# Patient Record
Sex: Female | Born: 1965 | Race: White | Hispanic: No | Marital: Married | Smoking: Never smoker
Health system: Southern US, Community
[De-identification: ages and names within clinical notes are randomized; demographics above are authoritative.]

## PROBLEM LIST (undated history)

## (undated) DIAGNOSIS — E119 Type 2 diabetes mellitus without complications: Secondary | ICD-10-CM

---

## 1998-07-30 ENCOUNTER — Other Ambulatory Visit: Admission: RE | Admit: 1998-07-30 | Discharge: 1998-07-30 | Payer: Self-pay | Admitting: Obstetrics and Gynecology

## 1999-04-28 ENCOUNTER — Encounter: Admission: RE | Admit: 1999-04-28 | Discharge: 1999-07-27 | Payer: Self-pay | Admitting: Obstetrics & Gynecology

## 1999-09-11 ENCOUNTER — Encounter: Admission: RE | Admit: 1999-09-11 | Discharge: 1999-12-10 | Payer: Self-pay | Admitting: *Deleted

## 1999-09-23 ENCOUNTER — Inpatient Hospital Stay (HOSPITAL_COMMUNITY): Admission: AD | Admit: 1999-09-23 | Discharge: 1999-09-24 | Payer: Self-pay | Admitting: Obstetrics and Gynecology

## 1999-09-23 ENCOUNTER — Encounter: Payer: Self-pay | Admitting: Obstetrics and Gynecology

## 1999-09-29 ENCOUNTER — Ambulatory Visit (HOSPITAL_COMMUNITY): Admission: RE | Admit: 1999-09-29 | Discharge: 1999-09-29 | Payer: Self-pay | Admitting: Obstetrics and Gynecology

## 1999-10-03 ENCOUNTER — Inpatient Hospital Stay (HOSPITAL_COMMUNITY): Admission: AD | Admit: 1999-10-03 | Discharge: 1999-10-03 | Payer: Self-pay | Admitting: Obstetrics and Gynecology

## 1999-10-10 ENCOUNTER — Inpatient Hospital Stay (HOSPITAL_COMMUNITY): Admission: AD | Admit: 1999-10-10 | Discharge: 1999-10-13 | Payer: Self-pay | Admitting: Obstetrics and Gynecology

## 1999-11-12 ENCOUNTER — Other Ambulatory Visit: Admission: RE | Admit: 1999-11-12 | Discharge: 1999-11-12 | Payer: Self-pay | Admitting: Obstetrics and Gynecology

## 2000-11-25 ENCOUNTER — Other Ambulatory Visit: Admission: RE | Admit: 2000-11-25 | Discharge: 2000-11-25 | Payer: Self-pay | Admitting: Obstetrics and Gynecology

## 2002-03-07 ENCOUNTER — Other Ambulatory Visit: Admission: RE | Admit: 2002-03-07 | Discharge: 2002-03-07 | Payer: Self-pay | Admitting: Obstetrics and Gynecology

## 2002-10-18 ENCOUNTER — Encounter (INDEPENDENT_AMBULATORY_CARE_PROVIDER_SITE_OTHER): Payer: Self-pay | Admitting: *Deleted

## 2002-10-19 ENCOUNTER — Ambulatory Visit (HOSPITAL_COMMUNITY): Admission: RE | Admit: 2002-10-19 | Discharge: 2002-10-19 | Payer: Self-pay | Admitting: Obstetrics and Gynecology

## 2003-04-23 ENCOUNTER — Other Ambulatory Visit: Admission: RE | Admit: 2003-04-23 | Discharge: 2003-04-23 | Payer: Self-pay | Admitting: Obstetrics and Gynecology

## 2005-08-31 ENCOUNTER — Other Ambulatory Visit: Admission: RE | Admit: 2005-08-31 | Discharge: 2005-08-31 | Payer: Self-pay | Admitting: Obstetrics and Gynecology

## 2007-01-20 ENCOUNTER — Encounter: Admission: RE | Admit: 2007-01-20 | Discharge: 2007-01-20 | Payer: Self-pay | Admitting: Obstetrics and Gynecology

## 2007-12-12 ENCOUNTER — Encounter: Admission: RE | Admit: 2007-12-12 | Discharge: 2007-12-12 | Payer: Self-pay | Admitting: Family Medicine

## 2008-02-02 ENCOUNTER — Encounter: Admission: RE | Admit: 2008-02-02 | Discharge: 2008-02-02 | Payer: Self-pay | Admitting: Obstetrics and Gynecology

## 2008-04-10 IMAGING — CT CT MAXILLOFACIAL W/O CM
3 of 5 series · 17 of 47 positions shown, 20 images · IV contrast (agent unspecified)
Comparison: none

CLINICAL DATA: Nasal and facial contusion after a fall.  Swollen right cheek.
 MAXILLOFACIAL CT WITHOUT CONTRAST:
TECHNIQUE: Axial and coronal CT imaging was performed through the maxillofacial structures.  No intravenous contrast was administered.

[Series 2: bone windows · axial · 0.33mm/px · z∈[-87,+60]mm · 11 of 69 slices shown, 14 images]
[im 5/69  brain]
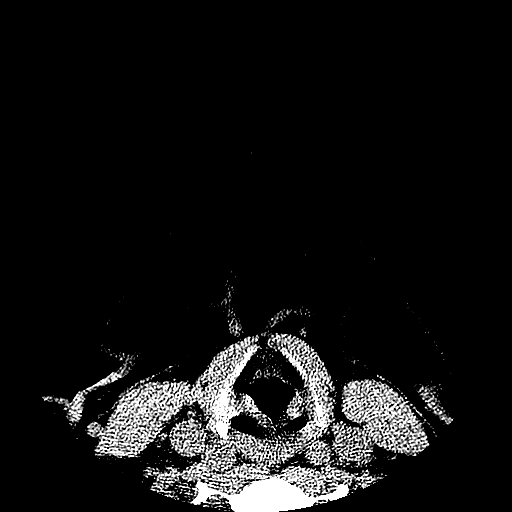
[im 5/69  bone]
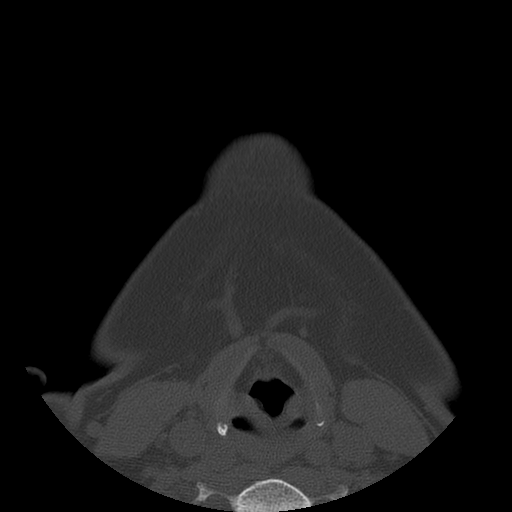
[im 10/69  bone]
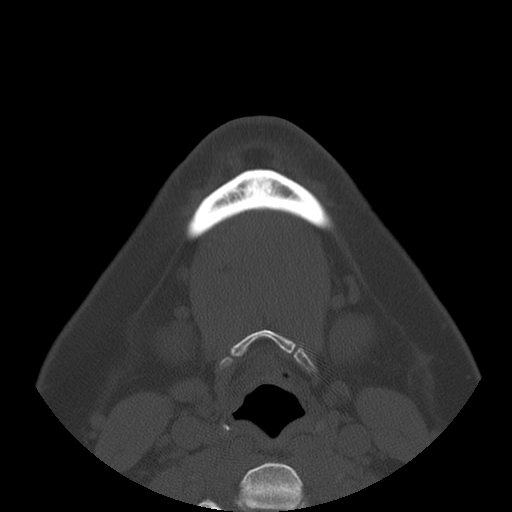
[im 15/69  bone]
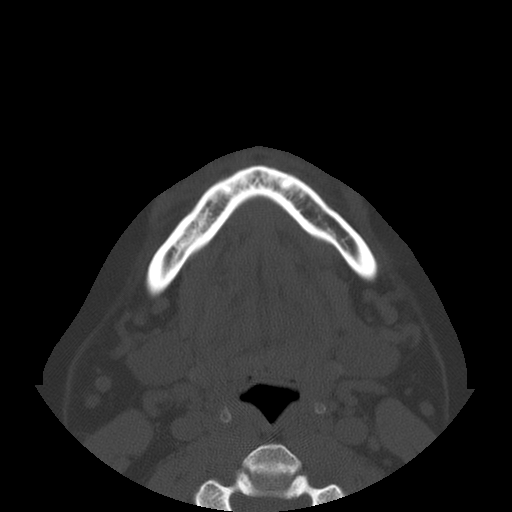
[im 25/69  bone]
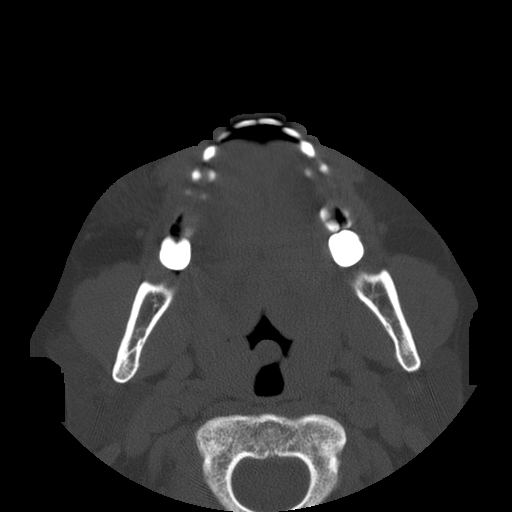
[im 30/69  brain]
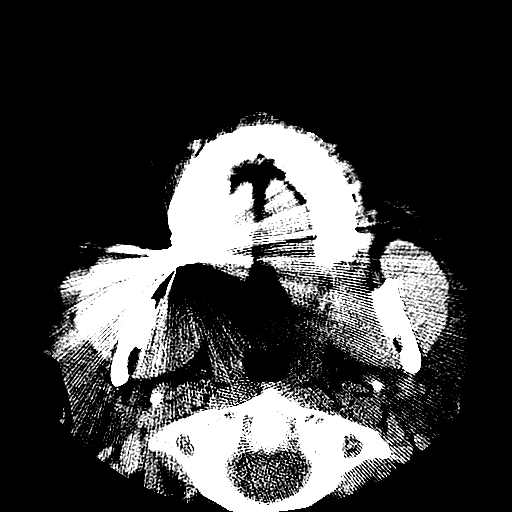
[im 30/69  bone]
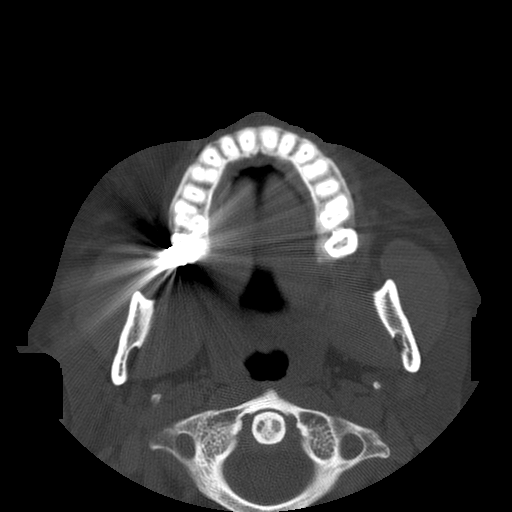
[im 35/69  bone]
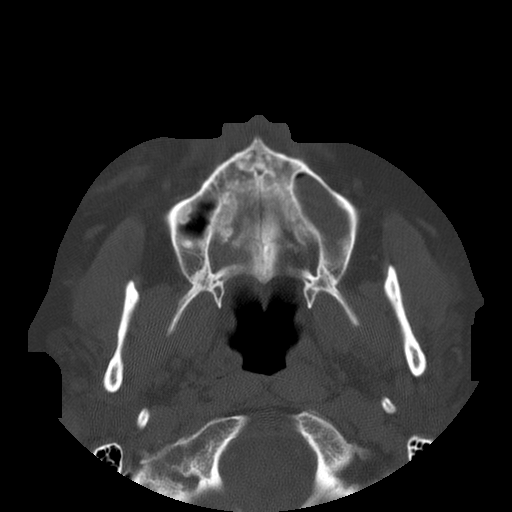
[im 39/69  bone]
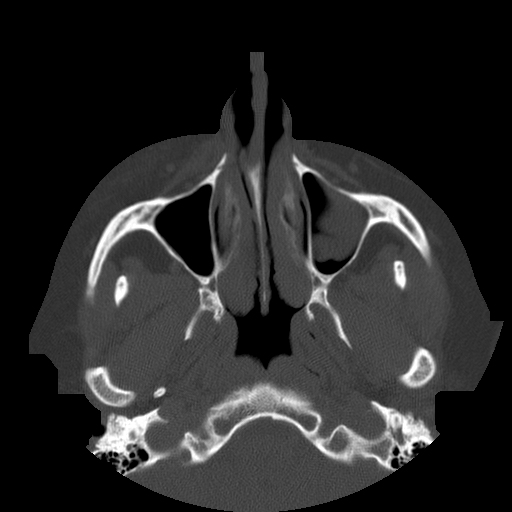
[im 44/69  bone]
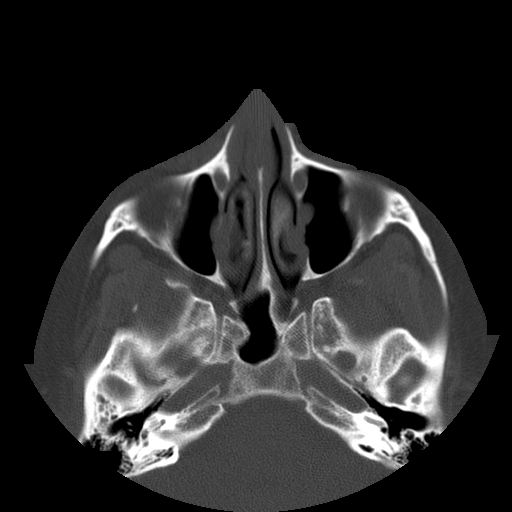
[im 54/69  brain]
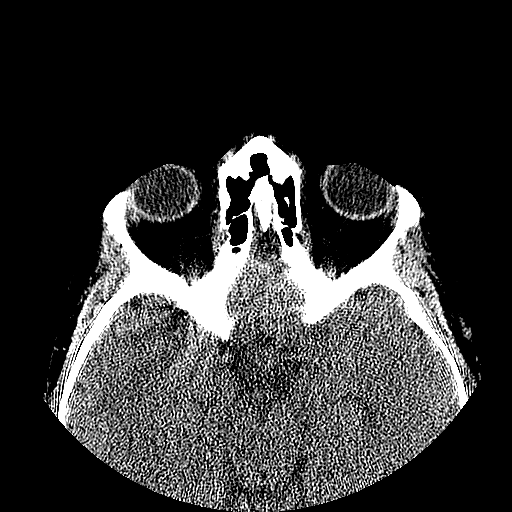
[im 54/69  bone]
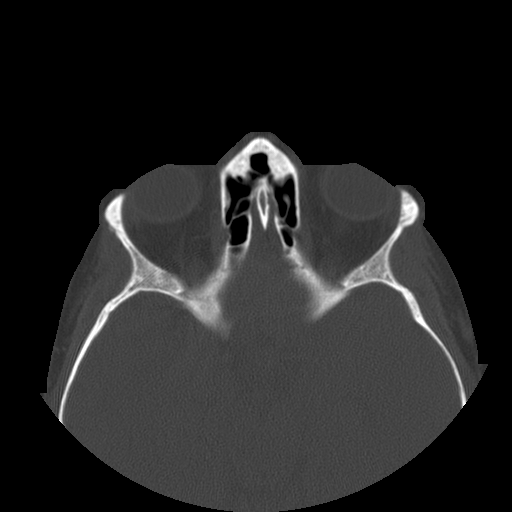
[im 59/69  bone]
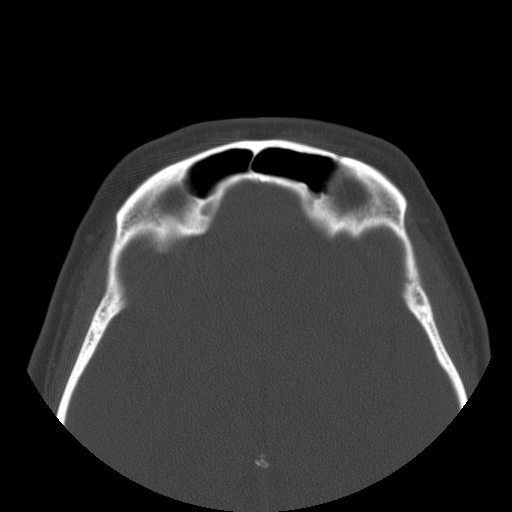
[im 64/69  bone]
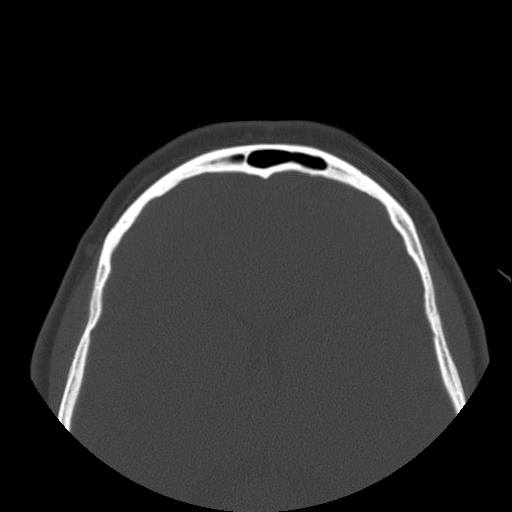

[Series 400: cor/bone · coronal · 0.34mm/px · 3 of 58 slices shown]
[im 15/58  bone]
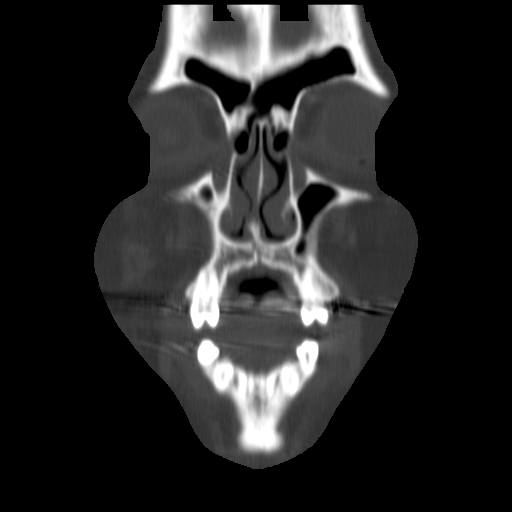
[im 29/58  bone]
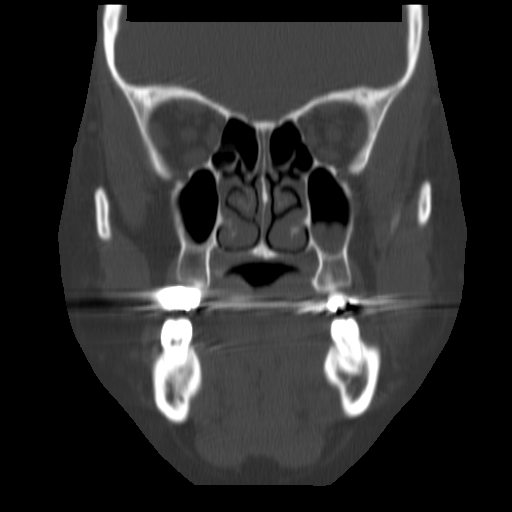
[im 43/58  bone]
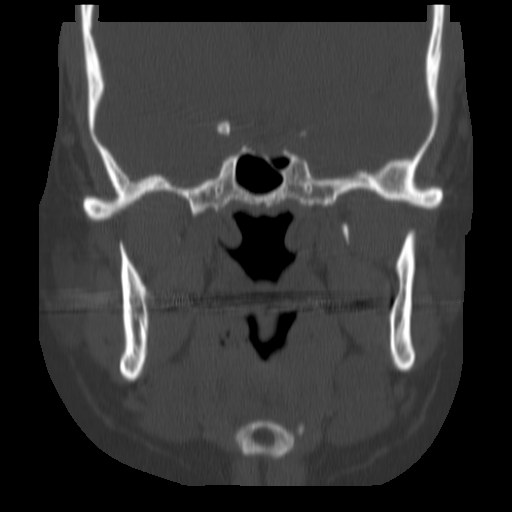

[Series 401: sag/bone · sagittal · 0.34mm/px · 3 of 66 slices shown]
[im 22/66  bone]
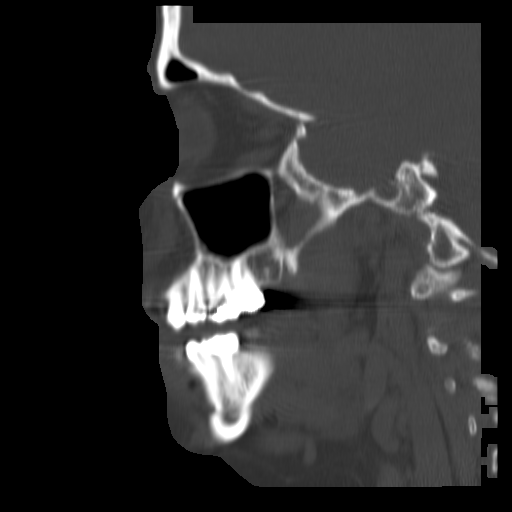
[im 33/66  bone]
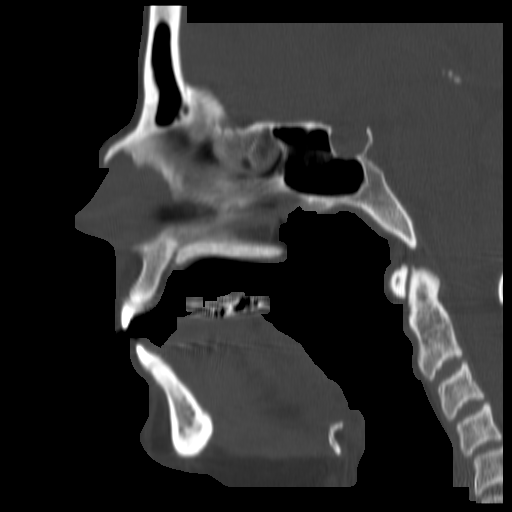
[im 44/66  bone]
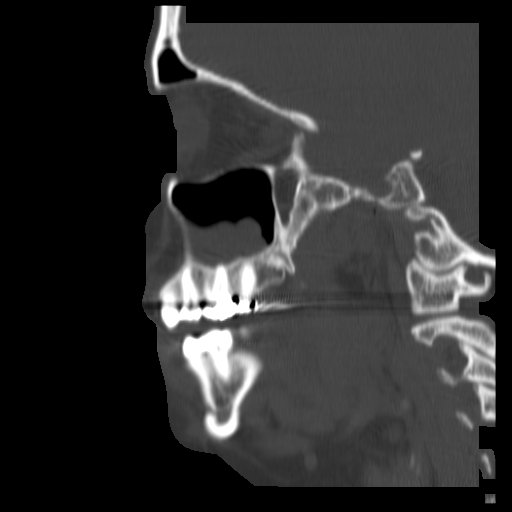

[17 of 47 positions shown; findings below may reference images not displayed]

FINDINGS: There is soft tissue swelling in the right cheek region.  There is no sign of facial fracture.  No sign of orbital injury.  There is a retention cyst at the floor of the left maxillary sinus but no sign of inflammatory sinus disease.
IMPRESSION: No fracture.  Swelling in the right cheek region.

## 2010-12-22 ENCOUNTER — Other Ambulatory Visit: Payer: Self-pay | Admitting: Internal Medicine

## 2010-12-22 DIAGNOSIS — R1900 Intra-abdominal and pelvic swelling, mass and lump, unspecified site: Secondary | ICD-10-CM

## 2010-12-25 ENCOUNTER — Ambulatory Visit
Admission: RE | Admit: 2010-12-25 | Discharge: 2010-12-25 | Disposition: A | Discharge status: Home / Self Care | Payer: BC Managed Care – PPO | Source: Ambulatory Visit | Attending: Internal Medicine | Admitting: Internal Medicine

## 2010-12-25 ENCOUNTER — Other Ambulatory Visit: Payer: Self-pay

## 2010-12-25 DIAGNOSIS — R1900 Intra-abdominal and pelvic swelling, mass and lump, unspecified site: Secondary | ICD-10-CM

## 2010-12-25 MED ORDER — IOHEXOL 300 MG/ML  SOLN
125.0000 mL | Freq: Once | INTRAMUSCULAR | Status: AC | PRN
  Administered 2010-12-25: 125 mL via INTRAVENOUS

## 2011-02-26 NOTE — H&P (Signed)
   NAME:  Cheryl Cox, Cheryl Cox                        ACCOUNT NO.:  [id number]   MEDICAL RECORD NO.:  [id number]                   PATIENT TYPE:  AMB   LOCATION:  [facility]                                  FACILITY:  [facility]   PHYSICIAN:  Dineen Kid. Rana Snare, M.D.                 DATE OF BIRTH:  1966/03/11   DATE OF ADMISSION:  10/19/2001  DATE OF DISCHARGE:                                HISTORY & PHYSICAL   HISTORY OF PRESENT ILLNESS:  Cheryl Cox is a 45 year old G2, P2 with abnormal  uterine bleeding, has varied depending upon birth control pills.  She had a  sonohysterogram which showed a 5.c echogenic area within the endometrial  cavity consistent with a polyp, otherwise normal-appearing uterus.  She  presents for hysteroscopy and D&C.   PAST MEDICAL HISTORY:  Significant for mild hypertension.   PAST OBSTETRICAL HISTORY:  She has had 2 vaginal deliveries complicated by  chronic hypertension and gestational diabetes.   PAST SURGICAL HISTORY:  Negative.   PHYSICAL EXAMINATION:  VITAL SIGNS:  Blood pressure 132/88.  LUNGS:  Clear to auscultation bilaterally.  CARDIOVASCULAR:  Regular rate and rhythm.  ABDOMEN:  Nontender, nondistended.  PELVIC:  Normal external genitalia, Bartholin's, Skene's, urethral.  Uterus  anteverted, mobile, nontender, no adnexal masses are palpable.   IMPRESSION/PLAN:  Abnormal uterine bleeding, endometrial mass on  sonohysterogram, bleeding not controlled with oral contraceptive agents.   Recommend hysteroscopy, D&C for evaluation and treatment of this. Risks and  benefits were discussed at length, which include, but are not limited to  risks of infection, bleeding, damage to uterus, tubes, ovaries, bowel or  bladder.  Informed consent was obtained.                                                     Dineen Kid Rana Snare, M.D.    DCL/MEDQ  D:  10/18/2002  T:  10/18/2002  Job:  [id number]

## 2011-02-26 NOTE — Op Note (Signed)
NAME:  Cheryl Cox, Cheryl Cox                        ACCOUNT NO.:  [id number]   MEDICAL RECORD NO.:  [id number]                   PATIENT TYPE:  AMB   LOCATION:  [facility]                                  FACILITY:  [facility]   PHYSICIAN:  Dineen Kid. Rana Snare, M.D.                 DATE OF BIRTH:  01/29/1966   DATE OF PROCEDURE:  10/19/2002  DATE OF DISCHARGE:                                 OPERATIVE REPORT   PREOPERATIVE DIAGNOSES:  1. Abnormal uterine bleeding.  2. Endometrial mass on sonohysterogram.   POSTOPERATIVE DIAGNOSES:  1. Abnormal uterine bleeding.  2. Endometrial mass on sonohysterogram.   PROCEDURES:  1. Hysteroscopy.  2. Dilatation and curettage.   SURGEON:  Dineen Kid. Rana Snare, M.D.   ANESTHESIA:  Monitored anesthetic care and paracervical block.   INDICATIONS FOR PROCEDURE:  The patient is a 45 year old G2, P2, with  irregular abnormal uterine bleeding not controlled with birth control pills.  She underwent a sonohysterogram which showed a posterior endometrial  polypoid area approximately 5 mm in size.  She was then sedated for  hysteroscopy, dilatation and curettage.  Risks and benefits were discussed  at length.  Informed consent was obtained.   FINDINGS AT TIME OF SURGERY:  Thickening of the posterior endometrium  consistent with either polypoid material or thickened endometrium.  Otherwise, normal-appearing ostia.  The endometrium was normal in appearance  after curettage.   DESCRIPTION OF PROCEDURE:  After adequate analgesia, the patient placed in  the dorsal lithotomy position.  She was sterilely prepped and draped and the  bladder was sterilely drained.  A Graves speculum was placed.  Xylocaine 1%  was injected at the anterior lip of the cervix.  Paracervical block was  placed with 1% Xylocaine with 1:100,000 epinephrine, 20 cc used.  The uterus  sounded to 8 cm easily and dilated with a 27 Pratt dilator and the  hysteroscope was inserted.  The above findings were noted.   This was  followed by sharp curettage which retrieved a small amount of endometrial  tissue.  After thorough curettage was performed the hysteroscope was  reinserted and the polypoid material which was previously seen was absent  and at this point a normal endometrium, normal-appearing ostia bilaterally  were noted.  There was some difficulty with the camera today so pictures  were not able to be taken.  At this time, because the endometrial lining  appeared to be normal, the hysteroscope was removed.  The tenaculum was  removed from the anterior lip of the cervix and after direct pressure  hemostasis was achieved.  Cervical deficit was 50 cc.  Estimated blood loss  less than 10 cc.  The patient tolerated the procedure well, was stable on  transfer to the recovery room.  Sponge, instrument, and needle counts were  normal x3.    DISPOSITION:  The patient will be discharged to home.  Will follow up  in the  office in two to three weeks.  She is sent home with a routine instruction  sheet for D&C.                                               Dineen Kid Rana Snare, M.D.    DCL/MEDQ  D:  10/19/2002  T:  10/19/2002  Job:  [id number]

## 2016-12-03 DIAGNOSIS — L0100 Impetigo, unspecified: Secondary | ICD-10-CM | POA: Diagnosis not present

## 2016-12-11 DIAGNOSIS — L03211 Cellulitis of face: Secondary | ICD-10-CM | POA: Diagnosis not present

## 2016-12-30 DIAGNOSIS — E1165 Type 2 diabetes mellitus with hyperglycemia: Secondary | ICD-10-CM | POA: Diagnosis not present

## 2016-12-30 DIAGNOSIS — I10 Essential (primary) hypertension: Secondary | ICD-10-CM | POA: Diagnosis not present

## 2017-01-04 DIAGNOSIS — E1165 Type 2 diabetes mellitus with hyperglycemia: Secondary | ICD-10-CM | POA: Diagnosis not present

## 2017-01-04 DIAGNOSIS — I10 Essential (primary) hypertension: Secondary | ICD-10-CM | POA: Diagnosis not present

## 2017-01-04 DIAGNOSIS — E785 Hyperlipidemia, unspecified: Secondary | ICD-10-CM | POA: Diagnosis not present

## 2017-04-04 DIAGNOSIS — E785 Hyperlipidemia, unspecified: Secondary | ICD-10-CM | POA: Diagnosis not present

## 2017-04-04 DIAGNOSIS — E1165 Type 2 diabetes mellitus with hyperglycemia: Secondary | ICD-10-CM | POA: Diagnosis not present

## 2017-04-04 DIAGNOSIS — I10 Essential (primary) hypertension: Secondary | ICD-10-CM | POA: Diagnosis not present

## 2017-04-07 DIAGNOSIS — E1165 Type 2 diabetes mellitus with hyperglycemia: Secondary | ICD-10-CM | POA: Diagnosis not present

## 2017-04-07 DIAGNOSIS — E559 Vitamin D deficiency, unspecified: Secondary | ICD-10-CM | POA: Diagnosis not present

## 2017-04-07 DIAGNOSIS — I10 Essential (primary) hypertension: Secondary | ICD-10-CM | POA: Diagnosis not present

## 2017-06-30 DIAGNOSIS — E1165 Type 2 diabetes mellitus with hyperglycemia: Secondary | ICD-10-CM | POA: Diagnosis not present

## 2017-07-05 DIAGNOSIS — E559 Vitamin D deficiency, unspecified: Secondary | ICD-10-CM | POA: Diagnosis not present

## 2017-07-05 DIAGNOSIS — E1165 Type 2 diabetes mellitus with hyperglycemia: Secondary | ICD-10-CM | POA: Diagnosis not present

## 2017-07-05 DIAGNOSIS — I10 Essential (primary) hypertension: Secondary | ICD-10-CM | POA: Diagnosis not present

## 2017-10-24 DIAGNOSIS — Z01419 Encounter for gynecological examination (general) (routine) without abnormal findings: Secondary | ICD-10-CM | POA: Diagnosis not present

## 2017-10-27 DIAGNOSIS — E1165 Type 2 diabetes mellitus with hyperglycemia: Secondary | ICD-10-CM | POA: Diagnosis not present

## 2017-10-27 DIAGNOSIS — E559 Vitamin D deficiency, unspecified: Secondary | ICD-10-CM | POA: Diagnosis not present

## 2017-10-27 DIAGNOSIS — E785 Hyperlipidemia, unspecified: Secondary | ICD-10-CM | POA: Diagnosis not present

## 2017-11-03 DIAGNOSIS — I10 Essential (primary) hypertension: Secondary | ICD-10-CM | POA: Diagnosis not present

## 2017-11-03 DIAGNOSIS — E559 Vitamin D deficiency, unspecified: Secondary | ICD-10-CM | POA: Diagnosis not present

## 2017-11-03 DIAGNOSIS — E1165 Type 2 diabetes mellitus with hyperglycemia: Secondary | ICD-10-CM | POA: Diagnosis not present

## 2017-11-28 DIAGNOSIS — D124 Benign neoplasm of descending colon: Secondary | ICD-10-CM | POA: Diagnosis not present

## 2017-11-28 DIAGNOSIS — Z1211 Encounter for screening for malignant neoplasm of colon: Secondary | ICD-10-CM | POA: Diagnosis not present

## 2017-11-28 DIAGNOSIS — K635 Polyp of colon: Secondary | ICD-10-CM | POA: Diagnosis not present

## 2017-11-28 DIAGNOSIS — D125 Benign neoplasm of sigmoid colon: Secondary | ICD-10-CM | POA: Diagnosis not present

## 2017-11-29 DIAGNOSIS — K635 Polyp of colon: Secondary | ICD-10-CM | POA: Diagnosis not present

## 2017-11-29 DIAGNOSIS — D125 Benign neoplasm of sigmoid colon: Secondary | ICD-10-CM | POA: Diagnosis not present

## 2017-11-29 DIAGNOSIS — D124 Benign neoplasm of descending colon: Secondary | ICD-10-CM | POA: Diagnosis not present

## 2018-02-06 DIAGNOSIS — Z7689 Persons encountering health services in other specified circumstances: Secondary | ICD-10-CM | POA: Diagnosis not present

## 2018-02-06 DIAGNOSIS — E1165 Type 2 diabetes mellitus with hyperglycemia: Secondary | ICD-10-CM | POA: Diagnosis not present

## 2018-02-06 DIAGNOSIS — E785 Hyperlipidemia, unspecified: Secondary | ICD-10-CM | POA: Diagnosis not present

## 2018-03-08 DIAGNOSIS — H04123 Dry eye syndrome of bilateral lacrimal glands: Secondary | ICD-10-CM | POA: Diagnosis not present

## 2018-03-08 DIAGNOSIS — E119 Type 2 diabetes mellitus without complications: Secondary | ICD-10-CM | POA: Diagnosis not present

## 2018-04-27 DIAGNOSIS — E1165 Type 2 diabetes mellitus with hyperglycemia: Secondary | ICD-10-CM | POA: Diagnosis not present

## 2018-05-04 DIAGNOSIS — I10 Essential (primary) hypertension: Secondary | ICD-10-CM | POA: Diagnosis not present

## 2018-05-04 DIAGNOSIS — E1165 Type 2 diabetes mellitus with hyperglycemia: Secondary | ICD-10-CM | POA: Diagnosis not present

## 2018-10-18 DIAGNOSIS — E1165 Type 2 diabetes mellitus with hyperglycemia: Secondary | ICD-10-CM | POA: Diagnosis not present

## 2018-10-18 DIAGNOSIS — E559 Vitamin D deficiency, unspecified: Secondary | ICD-10-CM | POA: Diagnosis not present

## 2018-10-18 DIAGNOSIS — E785 Hyperlipidemia, unspecified: Secondary | ICD-10-CM | POA: Diagnosis not present

## 2018-10-23 DIAGNOSIS — E559 Vitamin D deficiency, unspecified: Secondary | ICD-10-CM | POA: Diagnosis not present

## 2018-10-23 DIAGNOSIS — E1165 Type 2 diabetes mellitus with hyperglycemia: Secondary | ICD-10-CM | POA: Diagnosis not present

## 2018-10-23 DIAGNOSIS — E785 Hyperlipidemia, unspecified: Secondary | ICD-10-CM | POA: Diagnosis not present

## 2018-12-21 DIAGNOSIS — Z6838 Body mass index (BMI) 38.0-38.9, adult: Secondary | ICD-10-CM | POA: Diagnosis not present

## 2018-12-21 DIAGNOSIS — Z01419 Encounter for gynecological examination (general) (routine) without abnormal findings: Secondary | ICD-10-CM | POA: Diagnosis not present

## 2020-07-09 ENCOUNTER — Encounter (HOSPITAL_COMMUNITY): Payer: Self-pay

## 2020-07-09 ENCOUNTER — Other Ambulatory Visit: Payer: Self-pay

## 2020-07-09 DIAGNOSIS — R6883 Chills (without fever): Secondary | ICD-10-CM | POA: Insufficient documentation

## 2020-07-09 DIAGNOSIS — E119 Type 2 diabetes mellitus without complications: Secondary | ICD-10-CM | POA: Diagnosis not present

## 2020-07-09 DIAGNOSIS — R112 Nausea with vomiting, unspecified: Secondary | ICD-10-CM | POA: Insufficient documentation

## 2020-07-09 DIAGNOSIS — R1013 Epigastric pain: Secondary | ICD-10-CM | POA: Insufficient documentation

## 2020-07-09 DIAGNOSIS — Z7984 Long term (current) use of oral hypoglycemic drugs: Secondary | ICD-10-CM | POA: Insufficient documentation

## 2020-07-09 DIAGNOSIS — Z20822 Contact with and (suspected) exposure to covid-19: Secondary | ICD-10-CM | POA: Insufficient documentation

## 2020-07-09 DIAGNOSIS — R1011 Right upper quadrant pain: Secondary | ICD-10-CM | POA: Diagnosis present

## 2020-07-09 LAB — CBC
HCT: 40.3 % (ref 36.0–46.0)
Hemoglobin: 14.3 g/dL (ref 12.0–15.0)
MCH: 31.8 pg (ref 26.0–34.0)
MCHC: 35.5 g/dL (ref 30.0–36.0)
MCV: 89.8 fL (ref 80.0–100.0)
Platelets: 291 10*3/uL (ref 150–400)
RBC: 4.49 MIL/uL (ref 3.87–5.11)
RDW: 12.5 % (ref 11.5–15.5)
WBC: 13.6 10*3/uL — ABNORMAL HIGH (ref 4.0–10.5)
nRBC: 0 % (ref 0.0–0.2)

## 2020-07-09 LAB — I-STAT BETA HCG BLOOD, ED (MC, WL, AP ONLY): I-stat hCG, quantitative: <5 m[IU]/mL (ref <5)

## 2020-07-09 LAB — COMPREHENSIVE METABOLIC PANEL
ALT: 22 U/L (ref 0–44)
AST: 22 U/L (ref 15–41)
Albumin: 4.3 g/dL (ref 3.5–5.0)
Alkaline Phosphatase: 73 U/L (ref 38–126)
Anion gap: 16 — ABNORMAL HIGH (ref 5–15)
BUN: 12 mg/dL (ref 6–20)
CO2: 19 mmol/L — ABNORMAL LOW (ref 22–32)
Calcium: 9.7 mg/dL (ref 8.9–10.3)
Chloride: 103 mmol/L (ref 98–111)
Creatinine, Ser: 0.74 mg/dL (ref 0.44–1.00)
GFR calc Af Amer: >60 mL/min (ref >60)
GFR calc non Af Amer: >60 mL/min (ref >60)
Glucose, Bld: 183 mg/dL — ABNORMAL HIGH (ref 70–99)
Potassium: 4.4 mmol/L (ref 3.5–5.1)
Sodium: 138 mmol/L (ref 135–145)
Total Bilirubin: 1.3 mg/dL — ABNORMAL HIGH (ref 0.3–1.2)
Total Protein: 8.4 g/dL — ABNORMAL HIGH (ref 6.5–8.1)

## 2020-07-09 LAB — LIPASE, BLOOD: Lipase: 29 U/L (ref 11–51)

## 2020-07-09 NOTE — ED Triage Notes (Signed)
Pt sts upper abdominal pain since 1000 today. Seen at [facility] and directed here to evaluate liver.

## 2020-07-10 ENCOUNTER — Emergency Department (HOSPITAL_COMMUNITY)
Admission: EM | Admit: 2020-07-10 | Discharge: 2020-07-10 | Disposition: A | Discharge status: Home / Self Care | Payer: 59 | Attending: Emergency Medicine | Admitting: Emergency Medicine

## 2020-07-10 ENCOUNTER — Emergency Department (HOSPITAL_COMMUNITY): Payer: 59

## 2020-07-10 DIAGNOSIS — R112 Nausea with vomiting, unspecified: Secondary | ICD-10-CM

## 2020-07-10 DIAGNOSIS — R1013 Epigastric pain: Secondary | ICD-10-CM

## 2020-07-10 HISTORY — DX: Type 2 diabetes mellitus without complications: E11.9

## 2020-07-10 LAB — URINALYSIS, ROUTINE W REFLEX MICROSCOPIC
Bilirubin Urine: NEGATIVE
Glucose, UA: NEGATIVE mg/dL
Ketones, ur: 80 mg/dL — AB
Nitrite: NEGATIVE
Protein, ur: 30 mg/dL — AB
Specific Gravity, Urine: 1.028 (ref 1.005–1.030)
pH: 5 (ref 5.0–8.0)

## 2020-07-10 LAB — RESP PANEL BY RT PCR (RSV, FLU A&B, COVID)
Influenza A by PCR: NEGATIVE
Influenza B by PCR: NEGATIVE
Respiratory Syncytial Virus by PCR: NEGATIVE
SARS Coronavirus 2 by RT PCR: NEGATIVE

## 2020-07-10 MED ORDER — OMEPRAZOLE 20 MG PO CPDR: 20.0000 mg | DELAYED_RELEASE_CAPSULE | Freq: Every day | ORAL | 0 refills | Status: AC

## 2020-07-10 MED ORDER — PROMETHAZINE HCL 25 MG/ML IJ SOLN
12.5000 mg | Freq: Once | INTRAMUSCULAR | Status: AC
  Administered 2020-07-10: 12.5 mg via INTRAVENOUS
  Filled 2020-07-10: qty 1

## 2020-07-10 MED ORDER — PROMETHAZINE HCL 12.5 MG RE SUPP: 12.5000 mg | Freq: Three times a day (TID) | RECTAL | 0 refills | Status: AC | PRN

## 2020-07-10 MED ORDER — ONDANSETRON 4 MG PO TBDP
4.0000 mg | ORAL_TABLET | Freq: Once | ORAL | Status: AC
  Administered 2020-07-10: 4 mg via ORAL
  Filled 2020-07-10: qty 1

## 2020-07-10 MED ORDER — ALUM & MAG HYDROXIDE-SIMETH 200-200-20 MG/5ML PO SUSP
30.0000 mL | Freq: Once | ORAL | Status: AC
  Administered 2020-07-10: 30 mL via ORAL
  Filled 2020-07-10: qty 30

## 2020-07-10 MED ORDER — LIDOCAINE VISCOUS HCL 2 % MT SOLN
15.0000 mL | Freq: Once | OROMUCOSAL | Status: AC
  Administered 2020-07-10: 15 mL via ORAL
  Filled 2020-07-10: qty 15

## 2020-07-10 MED ORDER — MORPHINE SULFATE (PF) 4 MG/ML IV SOLN
4.0000 mg | Freq: Once | INTRAVENOUS | Status: AC
  Administered 2020-07-10: 4 mg via INTRAVENOUS
  Filled 2020-07-10: qty 1

## 2020-07-10 NOTE — ED Notes (Signed)
Discharge paperwork reviewed with pt. No questions or concerns expressed by pt.  Pt ambulatory with significant other to ED entrance.

## 2020-07-10 NOTE — Discharge Instructions (Signed)
You have been seen here for nausea and vomiting.  Lab work and imaging look reassuring.  I prescribed you Phenergan suppository please use as prescribed for nausea and vomiting.  I have also prescribed you an acid pill please take as prescribed.  I recommend staying away from spicy and acidic foods.  I also recommend staying from carbonated drinks and alcohol as this can worsen GERD-like symptoms.  I recommend follow-up with general surgeon for further evaluation management of your epigastric pain.  Come back to the emergency department if you develop severe abdominal pain, unable to hold food or water down, chest pain, shortness of breath, nausea, vomiting, diarrhea

## 2020-07-10 NOTE — ED Provider Notes (Signed)
[place] DEPT Provider Note   CSN: [id number] Arrival date & time: 07/09/20  1813     History Chief Complaint  Patient presents with  . Abdominal Pain    Cheryl Cox is a 54 y.o. female.  HPI   Patient with significant medical history of diabetes on Metformin presents to the emergent department with chief complaint of right upper abdominal pain that started yesterday morning at 10 AM.  Patient states she has had a sharp-like pain in her right upper quadrant as well as her epigastric region.  She endorses multiple episodes of vomiting which she describes as a yellow bile-like substance.  She denies seeing any blood or coffee Sorber emesis.  She does states she felt some chills but denies any fevers, diarrhea, dysuria, lower back pain.  She has no GI history, she states she still has her gallbladder and appendix, denies NSAIDs use, alcohol use, no history of pancreatitis, gallstones, small bowel obstructions.  Patient states she has been unable to hold any food or water down since yesterday.  She states lying down on her back tends to make her more nauseous and sodium makes her feel better.  Patient denies headache, fever, sore throat, cough, chest pain, shortness of breath, dysuria, worsening pedal edema.  Past Medical History:  Diagnosis Date  . Diabetes mellitus without complication ([place])     There are no problems to display for this patient.   History reviewed. No pertinent surgical history.   OB History   No obstetric history on file.     No family history on file.  Social History   Tobacco Use  . Smoking status: Not on file  Substance Use Topics  . Alcohol use: Not on file  . Drug use: Not on file    Home Medications Prior to Admission medications   Medication Sig Start Date End Date Taking? Authorizing Provider  atorvastatin (LIPITOR) 20 MG tablet Take 20 mg by mouth at bedtime.   Yes [provider]  Dulaglutide 1.5  MG/0.5ML SOPN Inject 1.5 mg into the skin every Sunday.   Yes [provider]  lisinopril (ZESTRIL) 5 MG tablet Take 5 mg by mouth at bedtime.   Yes [provider]  metFORMIN (GLUCOPHAGE-XR) 500 MG 24 hr tablet Take 2,000 mg by mouth at bedtime. 07/07/20  Yes [provider]  omeprazole (PRILOSEC) 20 MG capsule Take 1 capsule (20 mg total) by mouth daily for 21 days. 07/10/20 07/31/20  Marcello Fennel, PA-C  promethazine (PHENERGAN) 12.5 MG suppository Place 1 suppository (12.5 mg total) rectally every 8 (eight) hours as needed for up to 12 doses for nausea or vomiting. 07/10/20   Marcello Fennel, PA-C    Allergies    Patient has no known allergies.  Review of Systems   Review of Systems  Physical Exam Updated Vital Signs BP (!) 158/67   Pulse 87   Temp 97.9 F (36.6 C)   Resp 15   Ht 5\' 8"  (1.727 m)   Wt 104.3 kg   SpO2 98%   BMI 34.97 kg/m   Physical Exam  ED Results / Procedures / Treatments   Labs (all labs ordered are listed, but only abnormal results are displayed) Labs Reviewed  COMPREHENSIVE METABOLIC PANEL - Abnormal; Notable for the following components:      Result Value   CO2 19 (*)    Glucose, Bld 183 (*)    Total Protein 8.4 (*)    Total  Bilirubin 1.3 (*)    Anion gap 16 (*)    All other components within normal limits  CBC - Abnormal; Notable for the following components:   WBC 13.6 (*)    All other components within normal limits  URINALYSIS, ROUTINE W REFLEX MICROSCOPIC - Abnormal; Notable for the following components:   APPearance CLOUDY (*)    Hgb urine dipstick SMALL (*)    Ketones, ur 80 (*)    Protein, ur 30 (*)    Leukocytes,Ua TRACE (*)    Bacteria, UA FEW (*)    All other components within normal limits  RESP PANEL BY RT PCR (RSV, FLU A&B, COVID)  LIPASE, BLOOD  I-STAT BETA HCG BLOOD, ED ([facility], [facility], [facility] ONLY)    EKG None  Radiology US Abdomen Limited  Result Date: 07/10/2020 CLINICAL DATA:  Right upper  quadrant pain EXAM: ULTRASOUND ABDOMEN LIMITED RIGHT UPPER QUADRANT COMPARISON:  CT 12/25/2010 FINDINGS: Gallbladder: Echogenic nonshadowing nodule adjacent to the gallbladder wall measuring up to 5 mm suggestive of a polyp. No shadowing gallstones or wall thickening visualized. No sonographic Murphy sign noted by sonographer. Common bile duct: Diameter: 5 mm. Liver: No focal lesion identified. Within normal limits in parenchymal echogenicity. Portal vein is patent on color Doppler imaging with normal direction of blood flow towards the liver. Other: None. IMPRESSION: 1. Benign 5 mm gallbladder polyp incidentally noted. No evaluation or follow-up recommended. This recommendation follows ACR consensus guidelines: White Paper of the ACR Incidental Findings Committee II on Gallbladder and Biliary Findings. J Am Coll Radiol 2013:;10:953-956. 2. Otherwise unremarkable ultrasound of the right upper quadrant. No gallstones or evidence of cholecystitis. Electronically Signed   By: Davina Poke D.O.   On: 07/10/2020 08:58    Procedures Procedures (including critical care time)  Medications Ordered in ED Medications  ondansetron (ZOFRAN-ODT) disintegrating tablet 4 mg (4 mg Oral Given 07/10/20 0831)  promethazine (PHENERGAN) injection 12.5 mg (12.5 mg Intravenous Given 07/10/20 1004)  alum & mag hydroxide-simeth (MAALOX/MYLANTA) 200-200-20 MG/5ML suspension 30 mL (30 mLs Oral Given 07/10/20 1008)    And  lidocaine (XYLOCAINE) 2 % viscous mouth solution 15 mL (15 mLs Oral Given 07/10/20 1008)  morphine 4 MG/ML injection 4 mg (4 mg Intravenous Given 07/10/20 1000)    ED Course  I have reviewed the triage vital signs and the nursing notes.  Pertinent labs & imaging results that were available during my care of the patient were reviewed by me and considered in my medical decision making (see chart for details).    MDM Rules/Calculators/A&P                          Patient presents with epigastric and right  upper quadrant pain that started yesterday.  Patient was alert, did not appear in acute distress, vital signs reassuring.  Will order screening labs as well as imaging for further evaluation.  Will provide patient with antiemetics, pain medication and reevaluate.  CMP does not show electrolyte abnormality, metabolic acidosis CO2 19, hyperglycemia of 183, no AKI, T bili 1.3, anion gap 16.  CBC shows leukocytosis of 13.3, no signs of anemia.  Lipase is 29, hCG less than 5, UA positive for ketones, proteins, trace leukocytes, few bacteria.  Respiratory negative for Covid, influenza a/B, RSV.  Limited ultrasound shows benign 5 mm gallbladder polyp no recommend patient for further follow-up.  Patient was reevaluated after providing antiemetics and pain medication.  She states she is feeling  much better and has no more abdominal pain and is tolerating p.o. without any difficulty.  I have low suspicion for systemic infection as patient is nontoxic-appearing, vital signs reassuring, no obvious source of infection on exam.  I suspect leukocytosis is an acute phase reactant from the consistent nausea and vomiting or possible dehydration as she has ketones noted in her urine.  Low suspicion for intra-abdominal abnormality requiring surgical intervention as patient denies abdominal pain, tolerating p.o., no acute abdomen noted on exam.  Low suspicion for UTI, pyelonephritis as she denies urinary symptoms, UA shows trace leukocytes with few bacteria will not treat as she is asymptomatic.  Low suspicion patient is having from pancreatitis as she had a lipase of 29.  I suspect patient's CO2 of 19 and anion gap most likely secondary to her elevated glucose.  Will recommend fluid rehydration.  I suspect patient's pain may be secondary to the polyp noted in her gallbladder.  But cannot exclude exclude possible gastritis.  Will start patient on PPIs, provide antiemetics and have her follow-up with general surgery for further  evaluation.  Patient's vital signs have remained stable, no indication for hospital admission.  Patient was discussed with attending who agrees assessment plan.  Patient was given at home care strict return precautions.  Patient verbalized understood and agreed to plan. Final Clinical Impression(s) / ED Diagnoses Final diagnoses:  Epigastric pain  Non-intractable vomiting with nausea, unspecified vomiting type    Rx / DC Orders ED Discharge Orders         Ordered    promethazine (PHENERGAN) 12.5 MG suppository  Every 8 hours PRN        07/10/20 1204    omeprazole (PRILOSEC) 20 MG capsule  Daily        07/10/20 1204           Aron Baba 07/10/20 1339    Veryl Speak, MD 07/10/20 1416

## 2020-07-10 NOTE — ED Notes (Signed)
Pt able to eat pack of gram crackers and drink a cup of water without vomiting. States she stills feel a little nauseated.

## 2020-07-11 ENCOUNTER — Emergency Department (HOSPITAL_COMMUNITY): Admission: EM | Admit: 2020-07-11 | Discharge: 2020-07-11 | Discharge status: Against Medical Advice | Payer: 59

## 2020-07-11 ENCOUNTER — Other Ambulatory Visit: Payer: Self-pay

## 2020-07-12 ENCOUNTER — Encounter (HOSPITAL_COMMUNITY): Payer: Self-pay | Admitting: Emergency Medicine

## 2020-07-12 ENCOUNTER — Emergency Department (HOSPITAL_COMMUNITY): Payer: 59

## 2020-07-12 ENCOUNTER — Emergency Department (HOSPITAL_COMMUNITY)
Admission: EM | Admit: 2020-07-12 | Discharge: 2020-07-12 | Disposition: A | Discharge status: Home / Self Care | Payer: 59 | Attending: Emergency Medicine | Admitting: Emergency Medicine

## 2020-07-12 DIAGNOSIS — R002 Palpitations: Secondary | ICD-10-CM | POA: Diagnosis not present

## 2020-07-12 DIAGNOSIS — Z7984 Long term (current) use of oral hypoglycemic drugs: Secondary | ICD-10-CM | POA: Diagnosis not present

## 2020-07-12 DIAGNOSIS — R030 Elevated blood-pressure reading, without diagnosis of hypertension: Secondary | ICD-10-CM | POA: Insufficient documentation

## 2020-07-12 DIAGNOSIS — Z79899 Other long term (current) drug therapy: Secondary | ICD-10-CM | POA: Insufficient documentation

## 2020-07-12 DIAGNOSIS — E119 Type 2 diabetes mellitus without complications: Secondary | ICD-10-CM | POA: Diagnosis not present

## 2020-07-12 DIAGNOSIS — E876 Hypokalemia: Secondary | ICD-10-CM | POA: Diagnosis not present

## 2020-07-12 DIAGNOSIS — R079 Chest pain, unspecified: Secondary | ICD-10-CM | POA: Diagnosis present

## 2020-07-12 DIAGNOSIS — R112 Nausea with vomiting, unspecified: Secondary | ICD-10-CM

## 2020-07-12 LAB — CBC
HCT: 41.6 % (ref 36.0–46.0)
Hemoglobin: 14.2 g/dL (ref 12.0–15.0)
MCH: 30.9 pg (ref 26.0–34.0)
MCHC: 34.1 g/dL (ref 30.0–36.0)
MCV: 90.6 fL (ref 80.0–100.0)
Platelets: 293 10*3/uL (ref 150–400)
RBC: 4.59 MIL/uL (ref 3.87–5.11)
RDW: 12.1 % (ref 11.5–15.5)
WBC: 11.4 10*3/uL — ABNORMAL HIGH (ref 4.0–10.5)
nRBC: 0 % (ref 0.0–0.2)

## 2020-07-12 LAB — BASIC METABOLIC PANEL
Anion gap: 10 (ref 5–15)
BUN: 10 mg/dL (ref 6–20)
CO2: 24 mmol/L (ref 22–32)
Calcium: 8.9 mg/dL (ref 8.9–10.3)
Chloride: 99 mmol/L (ref 98–111)
Creatinine, Ser: 0.63 mg/dL (ref 0.44–1.00)
GFR calc Af Amer: >60 mL/min (ref >60)
GFR calc non Af Amer: >60 mL/min (ref >60)
Glucose, Bld: 119 mg/dL — ABNORMAL HIGH (ref 70–99)
Potassium: 3.3 mmol/L — ABNORMAL LOW (ref 3.5–5.1)
Sodium: 133 mmol/L — ABNORMAL LOW (ref 135–145)

## 2020-07-12 LAB — TROPONIN I (HIGH SENSITIVITY): Troponin I (High Sensitivity): 13 ng/L (ref <18)

## 2020-07-12 MED ORDER — ONDANSETRON HCL 4 MG PO TABS: 4.0000 mg | ORAL_TABLET | Freq: Three times a day (TID) | ORAL | 0 refills | Status: AC | PRN

## 2020-07-12 MED ORDER — ONDANSETRON 8 MG PO TBDP
8.0000 mg | ORAL_TABLET | Freq: Once | ORAL | Status: AC
  Administered 2020-07-12: 8 mg via ORAL
  Filled 2020-07-12: qty 1

## 2020-07-12 MED ORDER — POTASSIUM CHLORIDE CRYS ER 20 MEQ PO TBCR: 20.0000 meq | EXTENDED_RELEASE_TABLET | Freq: Two times a day (BID) | ORAL | 0 refills | Status: AC

## 2020-07-12 NOTE — Discharge Instructions (Addendum)
Drink plenty of fluids (clear liquids) then start a bland diet later this morning such as toast, crackers, jello, [employer] chicken noodle soup. Use the phenergan for nausea or vomiting. If your vomiting continues, get the zofran filled.  Take the potassium pills until gone, your potassium was low from the vomiting.  Please follow up with Dr Ashby Dawes about your blood pressure once you are feeling better.  Pain, stress, and anxiety can make your blood pressure get higher.

## 2020-07-12 NOTE — ED Provider Notes (Signed)
[facility] EMERGENCY DEPARTMENT Provider Note   CSN: [id number] Arrival date & time: 07/12/20  0024   Time seen 5:44 AM  History Chief Complaint  Patient presents with  . Chest Pain    Cheryl Cox is a 54 y.o. female.  HPI   Patient states she was having abdominal pain and was seen at [facility] emergency department on September 29 and was diagnosed of polyps in her gallbladder.  She states she was discharged from the hospital at noon on September 30.  She states since she got home she has a pounding in her chest and a fluttering.  She denies chest pain, shortness of breath, swelling of her legs, cough, or fever.  She has continued to have nausea and vomiting since 1030 on September 20 which prompted her ED visit.  Last time she vomited was at 130 this morning and she vomited twice on October 1.  She denies any diarrhea and states she has not had a bowel movement however she has not eaten in 3 days.  She states she has been taking her blood pressure at home and it has been very elevated.  She is not on medications for blood pressure but states because of her diabetes she was put on lisinopril.  Patient states she had gestational diabetes and has 2 children in the early 41s.  She has been treated for diabetes for 14 years.  She states her diabetes has been easy to control.  When she was seen in the ED she was prescribed Phenergan which she states she has not gotten filled because the pharmacy did not have it elevated a do have it now and omeprazole which she states she is taking.  Dates she took her blood pressure at home and it was 181/112 and she became concerned that something was wrong with her heart.  [facility] Dr. Ashby Dawes   Past Medical History:  Diagnosis Date  . Diabetes mellitus without complication ([place])     There are no problems to display for this patient.   History reviewed. No pertinent surgical history.   OB History   No obstetric  history on file.     History reviewed. No pertinent family history.  Social History   Tobacco Use  . Smoking status: Never Smoker  . Smokeless tobacco: Never Used  Vaping Use  . Vaping Use: Never used  Substance Use Topics  . Alcohol use: Not Currently  . Drug use: Never    Home Medications Prior to Admission medications   Medication Sig Start Date End Date Taking? Authorizing Provider  atorvastatin (LIPITOR) 20 MG tablet Take 20 mg by mouth at bedtime.    [provider]  Dulaglutide 1.5 MG/0.5ML SOPN Inject 1.5 mg into the skin every Sunday.    [provider]  lisinopril (ZESTRIL) 5 MG tablet Take 5 mg by mouth at bedtime.    [provider]  metFORMIN (GLUCOPHAGE-XR) 500 MG 24 hr tablet Take 2,000 mg by mouth at bedtime. 07/07/20   [provider]  omeprazole (PRILOSEC) 20 MG capsule Take 1 capsule (20 mg total) by mouth daily for 21 days. 07/10/20 07/31/20  Marcello Fennel, PA-C  ondansetron (ZOFRAN) 4 MG tablet Take 1 tablet (4 mg total) by mouth every 8 (eight) hours as needed. 07/12/20   Rolland Porter, MD  potassium chloride SA (KLOR-CON) 20 MEQ tablet Take 1 tablet (20 mEq total) by mouth 2 (two) times daily. 07/12/20   Rolland Porter,  MD  promethazine (PHENERGAN) 12.5 MG suppository Place 1 suppository (12.5 mg total) rectally every 8 (eight) hours as needed for up to 12 doses for nausea or vomiting. 07/10/20   Marcello Fennel, PA-C    Allergies    Patient has no known allergies.  Review of Systems   Review of Systems  All other systems reviewed and are negative.   Physical Exam Updated Vital Signs BP (!) 166/89   Pulse 67   Temp 98.9 F (37.2 C) (Oral)   Resp 17   Ht 5\' 8"  (1.727 m)   Wt 104 kg   SpO2 98%   BMI 34.86 kg/m   Physical Exam Vitals and nursing note reviewed.  Constitutional:      General: She is not in acute distress.    Appearance: She is obese. She is not ill-appearing or toxic-appearing.  HENT:      Head: Normocephalic and atraumatic.     Right Ear: External ear normal.     Left Ear: External ear normal.     Mouth/Throat:     Mouth: Mucous membranes are moist.  Eyes:     Extraocular Movements: Extraocular movements intact.     Conjunctiva/sclera: Conjunctivae normal.     Pupils: Pupils are equal, round, and reactive to light.  Cardiovascular:     Rate and Rhythm: Normal rate and regular rhythm.     Pulses: Normal pulses.     Heart sounds: Normal heart sounds. No murmur heard.   Pulmonary:     Effort: Pulmonary effort is normal. No respiratory distress.     Breath sounds: Normal breath sounds.  Chest:     Chest wall: No tenderness.  Abdominal:     General: Bowel sounds are normal.     Palpations: Abdomen is soft.     Tenderness: There is no abdominal tenderness.  Musculoskeletal:        General: Normal range of motion.     Cervical back: Normal range of motion.  Skin:    General: Skin is warm and dry.     Findings: No rash.  Neurological:     General: No focal deficit present.     Mental Status: She is alert and oriented to person, place, and time.     Cranial Nerves: No cranial nerve deficit.  Psychiatric:        Mood and Affect: Mood is anxious.        Speech: Speech is rapid and pressured.        Behavior: Behavior is cooperative.     ED Results / Procedures / Treatments   Labs (all labs ordered are listed, but only abnormal results are displayed) Results for orders placed or performed during the hospital encounter of 57/32/20  Basic metabolic panel  Result Value Ref Range   Sodium 133 (L) 135 - 145 mmol/L   Potassium 3.3 (L) 3.5 - 5.1 mmol/L   Chloride 99 98 - 111 mmol/L   CO2 24 22 - 32 mmol/L   Glucose, Bld 119 (H) 70 - 99 mg/dL   BUN 10 6 - 20 mg/dL   Creatinine, Ser 0.63 0.44 - 1.00 mg/dL   Calcium 8.9 8.9 - 10.3 mg/dL   GFR calc non Af Amer >60 >60 mL/min   GFR calc Af Amer >60 >60 mL/min   Anion gap 10 5 - 15  CBC  Result Value Ref Range   WBC  11.4 (H) 4.0 - 10.5 K/uL   RBC 4.59 3.87 -  5.11 MIL/uL   Hemoglobin 14.2 12.0 - 15.0 g/dL   HCT 41.6 36 - 46 %   MCV 90.6 80.0 - 100.0 fL   MCH 30.9 26.0 - 34.0 pg   MCHC 34.1 30.0 - 36.0 g/dL   RDW 12.1 11.5 - 15.5 %   Platelets 293 150 - 400 K/uL   nRBC 0.0 0.0 - 0.2 %  Troponin I (High Sensitivity)  Result Value Ref Range   Troponin I (High Sensitivity) 13 <18 ng/L   Laboratory interpretation all normal except mild hypokalemia     EKG EKG Interpretation  Date/Time:  Saturday July 12 2020 00:32:25 EDT Ventricular Rate:  76 PR Interval:  160 QRS Duration: 78 QT Interval:  376 QTC Calculation: 423 R Axis:   41 Text Interpretation: Normal sinus rhythm Normal ECG No old tracing to compare Confirmed by Rolland Porter [phone]) on 07/12/2020 1:12:53 AM   Radiology DG Chest 2 View  Result Date: 07/12/2020 CLINICAL DATA:  Chest pain and hypertension, recent diagnosis of gallbladder polyps EXAM: CHEST - 2 VIEW COMPARISON:  None. FINDINGS: No consolidation, features of edema, pneumothorax, or effusion. Pulmonary vascularity is normally distributed. The cardiomediastinal contours are unremarkable. No acute osseous or soft tissue abnormality. IMPRESSION: No acute cardiopulmonary abnormality. Electronically Signed   By: Lovena Le M.D.   On: 07/12/2020 01:03   US Abdomen Limited  Result Date: 07/10/2020 CLINICAL DATA:  Right upper quadrant pain  IMPRESSION: 1. Benign 5 mm gallbladder polyp incidentally noted. No evaluation or follow-up recommended. This recommendation follows ACR consensus guidelines: White Paper of the ACR Incidental Findings Committee II on Gallbladder and Biliary Findings. J Am Coll Radiol 2013:;10:953-956. 2. Otherwise unremarkable ultrasound of the right upper quadrant. No gallstones or evidence of cholecystitis. Electronically Signed   By: Davina Poke D.O.   On: 07/10/2020 08:58    Procedures Procedures (including critical care time)  Medications Ordered in  ED Medications  ondansetron (ZOFRAN-ODT) disintegrating tablet 8 mg (8 mg Oral Given 07/12/20 0109)    ED Course  I have reviewed the triage vital signs and the nursing notes.  Pertinent labs & imaging results that were available during my care of the patient were reviewed by me and considered in my medical decision making (see chart for details).    MDM Rules/Calculators/A&P                          During my exam patient's blood pressure was 157/77 without treatment. Her oxygen is 98% and her heart rate is 78.  She was given Zofran ODT while waiting for her laboratory test results.   Recheck at 6:40 AM patient states the Zofran has helped her nausea tremendously.  She has been drinking fluids.  Her blood pressure is now 148/76 without treatment.  Recheck at 7:20 AM patient is feeling better.  She has not had any more vomiting.  Her laboratory tests are resulted as she has a low potassium otherwise normal including her first troponin.  She states she has had the chest discomfort (actually an awareness of her heart pounding not pain) constantly for 36 hours so delta troponin is not needed.  Patient was discharged home.    Final Clinical Impression(s) / ED Diagnoses Final diagnoses:  Palpitations  Non-intractable vomiting with nausea, unspecified vomiting type  Hypokalemia    Rx / DC Orders ED Discharge Orders         Ordered    potassium chloride SA (  KLOR-CON) 20 MEQ tablet  2 times daily        07/12/20 0737    ondansetron (ZOFRAN) 4 MG tablet  Every 8 hours PRN        07/12/20 0737         Plan discharge  Rolland Porter, MD, Barbette Or, MD 07/12/20 [phone]

## 2020-07-12 NOTE — ED Notes (Signed)
Pt given sprite 

## 2020-07-12 NOTE — ED Triage Notes (Signed)
Pt here with c/o chest pain and HTN (181/112 @ home). States she was seen at [facility] yesterday and told she had a polyp on her gallbladder. Still complains of Nausea and vomiting as a result of that but chest pain is new.

## 2022-12-16 ENCOUNTER — Other Ambulatory Visit (HOSPITAL_COMMUNITY): Payer: Self-pay

## 2022-12-16 MED ORDER — TRULICITY 0.75 MG/0.5ML ~~LOC~~ SOAJ
0.7500 mg | SUBCUTANEOUS | 3 refills | Status: AC
  Filled 2022-12-16: qty 2, 28d supply, fill #0
  Filled 2023-01-10: qty 2, 28d supply, fill #1
  Filled 2023-02-06: qty 2, 28d supply, fill #2
  Filled 2023-03-08: qty 2, 28d supply, fill #3

## 2023-10-11 ENCOUNTER — Other Ambulatory Visit (HOSPITAL_BASED_OUTPATIENT_CLINIC_OR_DEPARTMENT_OTHER): Payer: Self-pay

## 2023-10-11 MED ORDER — TRULICITY 1.5 MG/0.5ML ~~LOC~~ SOAJ
1.5000 mg | SUBCUTANEOUS | 6 refills | Status: DC
  Filled 2023-10-21: qty 2, 28d supply, fill #0
  Filled 2023-11-19 (×3): qty 2, 28d supply, fill #1
  Filled 2023-12-17: qty 2, 28d supply, fill #2
  Filled 2024-01-17: qty 2, 28d supply, fill #3
  Filled 2024-02-12: qty 2, 28d supply, fill #4

## 2023-10-11 MED ORDER — ATORVASTATIN CALCIUM 20 MG PO TABS
20.0000 mg | ORAL_TABLET | Freq: Every day | ORAL | 3 refills | Status: AC
  Filled 2024-01-05 – 2024-01-07 (×2): qty 30, 30d supply, fill #0
  Filled 2024-02-06: qty 30, 30d supply, fill #1
  Filled 2024-03-10 (×2): qty 30, 30d supply, fill #2
  Filled 2024-04-14: qty 30, 30d supply, fill #3
  Filled 2024-05-09: qty 30, 30d supply, fill #4
  Filled 2024-06-06: qty 30, 30d supply, fill #5
  Filled 2024-07-05: qty 30, 30d supply, fill #6
  Filled 2024-08-08: qty 30, 30d supply, fill #7
  Filled 2024-09-15: qty 30, 30d supply, fill #8

## 2023-10-11 MED ORDER — METFORMIN HCL ER 500 MG PO TB24
1000.0000 mg | ORAL_TABLET | Freq: Two times a day (BID) | ORAL | 2 refills | Status: AC
  Filled 2023-11-21: qty 120, 30d supply, fill #0
  Filled 2024-01-05: qty 120, 30d supply, fill #1
  Filled 2024-02-05: qty 120, 30d supply, fill #2
  Filled 2024-03-10 (×2): qty 120, 30d supply, fill #3
  Filled 2024-04-09: qty 120, 30d supply, fill #4
  Filled 2024-05-09: qty 120, 30d supply, fill #5
  Filled 2024-07-10: qty 120, 30d supply, fill #6
  Filled 2024-08-04: qty 120, 30d supply, fill #7
  Filled 2024-08-28: qty 120, 30d supply, fill #8

## 2023-10-21 ENCOUNTER — Other Ambulatory Visit (HOSPITAL_BASED_OUTPATIENT_CLINIC_OR_DEPARTMENT_OTHER): Payer: Self-pay

## 2023-11-19 ENCOUNTER — Other Ambulatory Visit (HOSPITAL_BASED_OUTPATIENT_CLINIC_OR_DEPARTMENT_OTHER): Payer: Self-pay

## 2023-11-21 ENCOUNTER — Other Ambulatory Visit: Payer: Self-pay

## 2023-11-21 ENCOUNTER — Other Ambulatory Visit (HOSPITAL_BASED_OUTPATIENT_CLINIC_OR_DEPARTMENT_OTHER): Payer: Self-pay

## 2023-11-22 ENCOUNTER — Other Ambulatory Visit (HOSPITAL_BASED_OUTPATIENT_CLINIC_OR_DEPARTMENT_OTHER): Payer: Self-pay

## 2024-01-05 ENCOUNTER — Other Ambulatory Visit (HOSPITAL_BASED_OUTPATIENT_CLINIC_OR_DEPARTMENT_OTHER): Payer: Self-pay

## 2024-01-05 ENCOUNTER — Other Ambulatory Visit: Payer: Self-pay

## 2024-01-07 ENCOUNTER — Other Ambulatory Visit (HOSPITAL_BASED_OUTPATIENT_CLINIC_OR_DEPARTMENT_OTHER): Payer: Self-pay

## 2024-02-11 ENCOUNTER — Other Ambulatory Visit (HOSPITAL_BASED_OUTPATIENT_CLINIC_OR_DEPARTMENT_OTHER): Payer: Self-pay

## 2024-02-11 MED ORDER — LISINOPRIL 5 MG PO TABS
5.0000 mg | ORAL_TABLET | Freq: Every day | ORAL | 1 refills | Status: DC
  Filled 2024-02-11: qty 30, 30d supply, fill #0

## 2024-03-10 ENCOUNTER — Other Ambulatory Visit (HOSPITAL_BASED_OUTPATIENT_CLINIC_OR_DEPARTMENT_OTHER): Payer: Self-pay

## 2024-03-12 ENCOUNTER — Other Ambulatory Visit (HOSPITAL_BASED_OUTPATIENT_CLINIC_OR_DEPARTMENT_OTHER): Payer: Self-pay

## 2024-03-12 MED ORDER — TRULICITY 1.5 MG/0.5ML ~~LOC~~ SOAJ
1.5000 mg | SUBCUTANEOUS | 3 refills | Status: DC
  Filled 2024-03-12: qty 2, 28d supply, fill #0
  Filled 2024-04-14: qty 2, 28d supply, fill #1
  Filled 2024-05-09: qty 2, 28d supply, fill #2
  Filled 2024-06-06: qty 2, 28d supply, fill #3

## 2024-03-13 ENCOUNTER — Other Ambulatory Visit (HOSPITAL_BASED_OUTPATIENT_CLINIC_OR_DEPARTMENT_OTHER): Payer: Self-pay

## 2024-03-13 MED ORDER — LISINOPRIL 5 MG PO TABS
5.0000 mg | ORAL_TABLET | Freq: Every day | ORAL | 2 refills | Status: AC
  Filled 2024-03-13: qty 30, 30d supply, fill #0
  Filled 2024-04-14: qty 30, 30d supply, fill #1
  Filled 2024-05-14: qty 30, 30d supply, fill #2
  Filled 2024-06-06: qty 30, 30d supply, fill #3
  Filled 2024-07-05: qty 30, 30d supply, fill #4
  Filled 2024-08-12: qty 30, 30d supply, fill #5
  Filled 2024-08-30 – 2024-09-04 (×2): qty 30, 30d supply, fill #6
  Filled 2024-10-13: qty 30, 30d supply, fill #7
  Filled 2024-11-14: qty 30, 30d supply, fill #8
  Filled ????-??-??: fill #6

## 2024-04-14 ENCOUNTER — Other Ambulatory Visit (HOSPITAL_BASED_OUTPATIENT_CLINIC_OR_DEPARTMENT_OTHER): Payer: Self-pay

## 2024-05-14 ENCOUNTER — Other Ambulatory Visit (HOSPITAL_BASED_OUTPATIENT_CLINIC_OR_DEPARTMENT_OTHER): Payer: Self-pay

## 2024-05-16 ENCOUNTER — Other Ambulatory Visit (HOSPITAL_BASED_OUTPATIENT_CLINIC_OR_DEPARTMENT_OTHER): Payer: Self-pay

## 2024-07-05 ENCOUNTER — Other Ambulatory Visit (HOSPITAL_BASED_OUTPATIENT_CLINIC_OR_DEPARTMENT_OTHER): Payer: Self-pay

## 2024-07-06 ENCOUNTER — Other Ambulatory Visit: Payer: Self-pay

## 2024-07-06 ENCOUNTER — Other Ambulatory Visit (HOSPITAL_BASED_OUTPATIENT_CLINIC_OR_DEPARTMENT_OTHER): Payer: Self-pay

## 2024-07-06 MED ORDER — TRULICITY 1.5 MG/0.5ML ~~LOC~~ SOAJ
1.5000 mg | SUBCUTANEOUS | 3 refills | Status: DC
  Filled 2024-07-06: qty 2, 28d supply, fill #0
  Filled 2024-08-01: qty 2, 28d supply, fill #1
  Filled 2024-08-28: qty 2, 28d supply, fill #2
  Filled 2024-09-24: qty 2, 28d supply, fill #3

## 2024-07-07 ENCOUNTER — Other Ambulatory Visit (HOSPITAL_BASED_OUTPATIENT_CLINIC_OR_DEPARTMENT_OTHER): Payer: Self-pay

## 2024-07-10 ENCOUNTER — Other Ambulatory Visit (HOSPITAL_BASED_OUTPATIENT_CLINIC_OR_DEPARTMENT_OTHER): Payer: Self-pay

## 2024-08-04 ENCOUNTER — Other Ambulatory Visit (HOSPITAL_BASED_OUTPATIENT_CLINIC_OR_DEPARTMENT_OTHER): Payer: Self-pay

## 2024-08-29 ENCOUNTER — Other Ambulatory Visit (HOSPITAL_BASED_OUTPATIENT_CLINIC_OR_DEPARTMENT_OTHER): Payer: Self-pay

## 2024-08-30 ENCOUNTER — Other Ambulatory Visit (HOSPITAL_BASED_OUTPATIENT_CLINIC_OR_DEPARTMENT_OTHER): Payer: Self-pay

## 2024-09-15 ENCOUNTER — Other Ambulatory Visit (HOSPITAL_BASED_OUTPATIENT_CLINIC_OR_DEPARTMENT_OTHER): Payer: Self-pay

## 2024-09-28 ENCOUNTER — Other Ambulatory Visit (HOSPITAL_BASED_OUTPATIENT_CLINIC_OR_DEPARTMENT_OTHER): Payer: Self-pay

## 2024-10-13 ENCOUNTER — Other Ambulatory Visit (HOSPITAL_BASED_OUTPATIENT_CLINIC_OR_DEPARTMENT_OTHER): Payer: Self-pay

## 2024-10-15 ENCOUNTER — Other Ambulatory Visit: Payer: Self-pay

## 2024-10-18 ENCOUNTER — Other Ambulatory Visit (HOSPITAL_BASED_OUTPATIENT_CLINIC_OR_DEPARTMENT_OTHER): Payer: Self-pay

## 2024-10-19 ENCOUNTER — Other Ambulatory Visit (HOSPITAL_BASED_OUTPATIENT_CLINIC_OR_DEPARTMENT_OTHER): Payer: Self-pay

## 2024-10-19 MED ORDER — METFORMIN HCL ER 500 MG PO TB24
1000.0000 mg | ORAL_TABLET | Freq: Two times a day (BID) | ORAL | 1 refills | Status: AC
  Filled 2024-10-19: qty 120, 30d supply, fill #0
  Filled 2024-11-14: qty 120, 30d supply, fill #1

## 2024-10-26 ENCOUNTER — Other Ambulatory Visit (HOSPITAL_BASED_OUTPATIENT_CLINIC_OR_DEPARTMENT_OTHER): Payer: Self-pay

## 2024-10-26 MED ORDER — TRULICITY 1.5 MG/0.5ML ~~LOC~~ SOAJ
1.5000 mg | SUBCUTANEOUS | 0 refills | Status: DC
  Filled 2024-10-26: qty 2, 28d supply, fill #0

## 2024-10-27 ENCOUNTER — Other Ambulatory Visit (HOSPITAL_BASED_OUTPATIENT_CLINIC_OR_DEPARTMENT_OTHER): Payer: Self-pay

## 2024-11-03 ENCOUNTER — Other Ambulatory Visit (HOSPITAL_BASED_OUTPATIENT_CLINIC_OR_DEPARTMENT_OTHER): Payer: Self-pay

## 2024-11-14 ENCOUNTER — Other Ambulatory Visit (HOSPITAL_BASED_OUTPATIENT_CLINIC_OR_DEPARTMENT_OTHER): Payer: Self-pay

## 2024-11-15 ENCOUNTER — Other Ambulatory Visit: Payer: Self-pay

## 2024-11-15 ENCOUNTER — Other Ambulatory Visit (HOSPITAL_BASED_OUTPATIENT_CLINIC_OR_DEPARTMENT_OTHER): Payer: Self-pay

## 2024-11-15 MED ORDER — TRULICITY 1.5 MG/0.5ML ~~LOC~~ SOAJ
1.5000 mg | SUBCUTANEOUS | 6 refills | Status: AC
  Filled 2024-11-15: qty 2, 28d supply, fill #0
